# Patient Record
Sex: Male | Born: 1970 | Race: Black or African American | Hispanic: No | Marital: Married
Health system: Southern US, Community
[De-identification: ages and names within clinical notes are randomized; demographics above are authoritative.]

## PROBLEM LIST (undated history)

## (undated) DIAGNOSIS — I10 Essential (primary) hypertension: Secondary | ICD-10-CM

## (undated) HISTORY — PX: FRACTURE SURGERY: SHX138

## (undated) HISTORY — DX: Essential (primary) hypertension: I10

---

## 1998-08-14 ENCOUNTER — Emergency Department (HOSPITAL_COMMUNITY): Admission: EM | Admit: 1998-08-14 | Discharge: 1998-08-14 | Payer: Self-pay | Admitting: Emergency Medicine

## 2002-11-23 ENCOUNTER — Emergency Department (HOSPITAL_COMMUNITY): Admission: EM | Admit: 2002-11-23 | Discharge: 2002-11-23 | Payer: Self-pay | Admitting: Emergency Medicine

## 2003-10-10 ENCOUNTER — Emergency Department (HOSPITAL_COMMUNITY): Admission: EM | Admit: 2003-10-10 | Discharge: 2003-10-10 | Payer: Self-pay | Admitting: Emergency Medicine

## 2003-12-11 ENCOUNTER — Emergency Department (HOSPITAL_COMMUNITY): Admission: EM | Admit: 2003-12-11 | Discharge: 2003-12-11 | Payer: Self-pay | Admitting: Emergency Medicine

## 2004-02-26 ENCOUNTER — Emergency Department (HOSPITAL_COMMUNITY): Admission: EM | Admit: 2004-02-26 | Discharge: 2004-02-26 | Payer: Self-pay | Admitting: Emergency Medicine

## 2004-04-14 ENCOUNTER — Emergency Department (HOSPITAL_COMMUNITY): Admission: EM | Admit: 2004-04-14 | Discharge: 2004-04-14 | Payer: Self-pay | Admitting: Emergency Medicine

## 2004-05-12 ENCOUNTER — Emergency Department (HOSPITAL_COMMUNITY): Admission: EM | Admit: 2004-05-12 | Discharge: 2004-05-13 | Payer: Self-pay | Admitting: *Deleted

## 2006-05-31 ENCOUNTER — Emergency Department (HOSPITAL_COMMUNITY): Admission: EM | Admit: 2006-05-31 | Discharge: 2006-05-31 | Payer: Self-pay | Admitting: Emergency Medicine

## 2006-06-03 ENCOUNTER — Emergency Department (HOSPITAL_COMMUNITY): Admission: EM | Admit: 2006-06-03 | Discharge: 2006-06-03 | Payer: Self-pay | Admitting: Family Medicine

## 2008-09-20 ENCOUNTER — Emergency Department (HOSPITAL_COMMUNITY): Admission: EM | Admit: 2008-09-20 | Discharge: 2008-09-20 | Payer: Self-pay | Admitting: Emergency Medicine

## 2009-02-27 ENCOUNTER — Emergency Department (HOSPITAL_COMMUNITY): Admission: EM | Admit: 2009-02-27 | Discharge: 2009-02-27 | Payer: Self-pay | Admitting: Emergency Medicine

## 2009-04-04 ENCOUNTER — Emergency Department (HOSPITAL_COMMUNITY): Admission: EM | Admit: 2009-04-04 | Discharge: 2009-04-04 | Payer: Self-pay | Admitting: Emergency Medicine

## 2010-03-23 ENCOUNTER — Emergency Department (HOSPITAL_COMMUNITY): Admission: EM | Admit: 2010-03-23 | Discharge: 2010-03-23 | Payer: Self-pay | Admitting: Emergency Medicine

## 2010-11-07 LAB — GC/CHLAMYDIA PROBE AMP, GENITAL
Chlamydia, DNA Probe: NEGATIVE
GC Probe Amp, Genital: POSITIVE — AB

## 2011-01-08 ENCOUNTER — Inpatient Hospital Stay (INDEPENDENT_AMBULATORY_CARE_PROVIDER_SITE_OTHER)
Admission: RE | Admit: 2011-01-08 | Discharge: 2011-01-08 | Disposition: A | Discharge status: Home / Self Care | Payer: Self-pay | Source: Ambulatory Visit | Attending: Family Medicine | Admitting: Family Medicine

## 2011-01-08 DIAGNOSIS — K047 Periapical abscess without sinus: Secondary | ICD-10-CM

## 2011-05-24 ENCOUNTER — Emergency Department (HOSPITAL_COMMUNITY)
Admission: EM | Admit: 2011-05-24 | Discharge: 2011-05-24 | Disposition: A | Discharge status: Home / Self Care | Payer: Self-pay | Attending: Emergency Medicine | Admitting: Emergency Medicine

## 2011-05-24 ENCOUNTER — Emergency Department (HOSPITAL_COMMUNITY): Payer: Self-pay

## 2011-05-24 DIAGNOSIS — N201 Calculus of ureter: Secondary | ICD-10-CM | POA: Insufficient documentation

## 2011-05-24 LAB — BASIC METABOLIC PANEL
BUN: 16 mg/dL (ref 6–23)
Calcium: 10 mg/dL (ref 8.4–10.5)
Creatinine, Ser: 1.38 mg/dL — ABNORMAL HIGH (ref 0.50–1.35)
GFR calc Af Amer: 73 mL/min — ABNORMAL LOW (ref >90)
GFR calc non Af Amer: 63 mL/min — ABNORMAL LOW (ref >90)
Glucose, Bld: 105 mg/dL — ABNORMAL HIGH (ref 70–99)

## 2011-05-24 LAB — CBC
HCT: 39 % (ref 39.0–52.0)
Hemoglobin: 12 g/dL — ABNORMAL LOW (ref 13.0–17.0)
MCHC: 30.8 g/dL (ref 30.0–36.0)
Platelets: 173 10*3/uL (ref 150–400)
RBC: 5.2 MIL/uL (ref 4.22–5.81)
WBC: 6.9 10*3/uL (ref 4.0–10.5)

## 2011-05-24 LAB — URINALYSIS, ROUTINE W REFLEX MICROSCOPIC
Leukocytes, UA: NEGATIVE
Nitrite: NEGATIVE
Protein, ur: NEGATIVE mg/dL
Urobilinogen, UA: 0.2 mg/dL (ref 0.0–1.0)

## 2011-05-24 LAB — DIFFERENTIAL
Basophils Absolute: 0 10*3/uL (ref 0.0–0.1)
Basophils Relative: 0 % (ref 0–1)
Lymphs Abs: 1.2 10*3/uL (ref 0.7–4.0)
Monocytes Relative: 5 % (ref 3–12)
Neutrophils Relative %: 77 % (ref 43–77)

## 2013-03-10 ENCOUNTER — Other Ambulatory Visit: Payer: Self-pay | Admitting: Family Medicine

## 2013-03-10 DIAGNOSIS — R42 Dizziness and giddiness: Secondary | ICD-10-CM

## 2013-03-13 ENCOUNTER — Ambulatory Visit
Admission: RE | Admit: 2013-03-13 | Discharge: 2013-03-13 | Disposition: A | Discharge status: Home / Self Care | Payer: BC Managed Care – PPO | Source: Ambulatory Visit | Attending: Family Medicine | Admitting: Family Medicine

## 2013-03-13 DIAGNOSIS — R42 Dizziness and giddiness: Secondary | ICD-10-CM

## 2013-03-13 MED ORDER — GADOBENATE DIMEGLUMINE 529 MG/ML IV SOLN
20.0000 mL | Freq: Once | INTRAVENOUS | Status: AC | PRN
  Administered 2013-03-13: 20 mL via INTRAVENOUS

## 2014-01-23 ENCOUNTER — Ambulatory Visit: Payer: BC Managed Care – PPO

## 2016-03-25 ENCOUNTER — Encounter (HOSPITAL_COMMUNITY): Payer: Self-pay | Admitting: Emergency Medicine

## 2016-03-25 ENCOUNTER — Emergency Department (HOSPITAL_COMMUNITY): Payer: Self-pay

## 2016-03-25 ENCOUNTER — Emergency Department (HOSPITAL_COMMUNITY)
Admission: EM | Admit: 2016-03-25 | Discharge: 2016-03-25 | Disposition: A | Discharge status: Home / Self Care | Payer: Self-pay | Attending: Emergency Medicine | Admitting: Emergency Medicine

## 2016-03-25 DIAGNOSIS — Y929 Unspecified place or not applicable: Secondary | ICD-10-CM | POA: Insufficient documentation

## 2016-03-25 DIAGNOSIS — R791 Abnormal coagulation profile: Secondary | ICD-10-CM | POA: Insufficient documentation

## 2016-03-25 DIAGNOSIS — Y999 Unspecified external cause status: Secondary | ICD-10-CM | POA: Insufficient documentation

## 2016-03-25 DIAGNOSIS — Y939 Activity, unspecified: Secondary | ICD-10-CM | POA: Insufficient documentation

## 2016-03-25 DIAGNOSIS — T1490XA Injury, unspecified, initial encounter: Secondary | ICD-10-CM

## 2016-03-25 DIAGNOSIS — T148XXA Other injury of unspecified body region, initial encounter: Secondary | ICD-10-CM

## 2016-03-25 DIAGNOSIS — S21112A Laceration without foreign body of left front wall of thorax without penetration into thoracic cavity, initial encounter: Secondary | ICD-10-CM | POA: Insufficient documentation

## 2016-03-25 LAB — CBC WITH DIFFERENTIAL/PLATELET
Basophils Absolute: 0 10*3/uL (ref 0.0–0.1)
Basophils Relative: 0 %
EOS ABS: 0.1 10*3/uL (ref 0.0–0.7)
EOS PCT: 1 %
HCT: 42.4 % (ref 39.0–52.0)
Hemoglobin: 13.1 g/dL (ref 13.0–17.0)
LYMPHS ABS: 1 10*3/uL (ref 0.7–4.0)
Lymphocytes Relative: 14 %
MCH: 23.4 pg — AB (ref 26.0–34.0)
MCHC: 30.9 g/dL (ref 30.0–36.0)
MCV: 75.6 fL — ABNORMAL LOW (ref 78.0–100.0)
MONO ABS: 0.2 10*3/uL (ref 0.1–1.0)
Monocytes Relative: 3 %
Neutro Abs: 5.8 10*3/uL (ref 1.7–7.7)
Neutrophils Relative %: 82 %
PLATELETS: 181 10*3/uL (ref 150–400)
RBC: 5.61 MIL/uL (ref 4.22–5.81)
RDW: 13.5 % (ref 11.5–15.5)
WBC: 7.1 10*3/uL (ref 4.0–10.5)

## 2016-03-25 LAB — COMPREHENSIVE METABOLIC PANEL
ALT: 28 U/L (ref 17–63)
AST: 30 U/L (ref 15–41)
Albumin: 4.8 g/dL (ref 3.5–5.0)
Alkaline Phosphatase: 51 U/L (ref 38–126)
Anion gap: 11 (ref 5–15)
BUN: 11 mg/dL (ref 6–20)
CO2: 20 mmol/L — ABNORMAL LOW (ref 22–32)
Calcium: 9.3 mg/dL (ref 8.9–10.3)
Chloride: 109 mmol/L (ref 101–111)
Creatinine, Ser: 1.43 mg/dL — ABNORMAL HIGH (ref 0.61–1.24)
GFR calc Af Amer: >60 mL/min (ref >60)
GFR calc non Af Amer: 58 mL/min — ABNORMAL LOW (ref >60)
Glucose, Bld: 93 mg/dL (ref 65–99)
Potassium: 3.4 mmol/L — ABNORMAL LOW (ref 3.5–5.1)
Sodium: 140 mmol/L (ref 135–145)
Total Bilirubin: 0.5 mg/dL (ref 0.3–1.2)
Total Protein: 7.5 g/dL (ref 6.5–8.1)

## 2016-03-25 LAB — TYPE AND SCREEN
ABO/RH(D): O POS
Antibody Screen: NEGATIVE
Unit division: 0
Unit division: 0

## 2016-03-25 LAB — I-STAT CHEM 8, ED
BUN: 13 mg/dL (ref 6–20)
CALCIUM ION: 1.13 mmol/L (ref 1.13–1.30)
CHLORIDE: 106 mmol/L (ref 101–111)
Creatinine, Ser: 1.7 mg/dL — ABNORMAL HIGH (ref 0.61–1.24)
GLUCOSE: 95 mg/dL (ref 65–99)
HCT: 45 % (ref 39.0–52.0)
Hemoglobin: 15.3 g/dL (ref 13.0–17.0)
Potassium: 3.4 mmol/L — ABNORMAL LOW (ref 3.5–5.1)
Sodium: 144 mmol/L (ref 135–145)
TCO2: 20 mmol/L (ref 0–100)

## 2016-03-25 LAB — PREPARE FRESH FROZEN PLASMA
UNIT DIVISION: 0
UNIT DIVISION: 0

## 2016-03-25 LAB — I-STAT CG4 LACTIC ACID, ED: Lactic Acid, Venous: 2.1 mmol/L (ref 0.5–1.9)

## 2016-03-25 LAB — PROTIME-INR
INR: 1.08
Prothrombin Time: 14 seconds (ref 11.4–15.2)

## 2016-03-25 LAB — CDS SEROLOGY

## 2016-03-25 LAB — ETHANOL: Alcohol, Ethyl (B): 198 mg/dL — ABNORMAL HIGH (ref <5)

## 2016-03-25 LAB — ABO/RH: ABO/RH(D): O POS

## 2016-03-25 MED ORDER — IOPAMIDOL (ISOVUE-300) INJECTION 61%
INTRAVENOUS | Status: AC
  Administered 2016-03-25: 75 mL
  Filled 2016-03-25: qty 75

## 2016-03-25 MED ORDER — TRAMADOL HCL 50 MG PO TABS: 50.0000 mg | ORAL_TABLET | Freq: Four times a day (QID) | ORAL | 0 refills | Status: DC | PRN

## 2016-03-25 MED ORDER — SULFAMETHOXAZOLE-TRIMETHOPRIM 800-160 MG PO TABS: 1.0000 | ORAL_TABLET | Freq: Two times a day (BID) | ORAL | 0 refills | Status: AC
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
## 2016-03-25 NOTE — ED Notes (Signed)
Family at bedside. 

## 2016-03-25 NOTE — ED Triage Notes (Signed)
See trauma narrator 
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
## 2020-07-05 ENCOUNTER — Other Ambulatory Visit: Payer: Self-pay

## 2020-07-05 ENCOUNTER — Ambulatory Visit (HOSPITAL_COMMUNITY)
Admission: EM | Admit: 2020-07-05 | Discharge: 2020-07-05 | Disposition: A | Discharge status: Home / Self Care | Payer: No Typology Code available for payment source | Attending: Emergency Medicine | Admitting: Emergency Medicine

## 2020-07-05 ENCOUNTER — Encounter (HOSPITAL_COMMUNITY): Payer: Self-pay | Admitting: Emergency Medicine

## 2020-07-05 DIAGNOSIS — K625 Hemorrhage of anus and rectum: Secondary | ICD-10-CM | POA: Insufficient documentation

## 2020-07-05 DIAGNOSIS — I10 Essential (primary) hypertension: Secondary | ICD-10-CM | POA: Insufficient documentation

## 2020-07-05 LAB — CBC WITH DIFFERENTIAL/PLATELET
Abs Immature Granulocytes: 0.02 10*3/uL (ref 0.00–0.07)
Basophils Absolute: 0 10*3/uL (ref 0.0–0.1)
Basophils Relative: 1 %
Eosinophils Absolute: 0.1 10*3/uL (ref 0.0–0.5)
Eosinophils Relative: 2 %
HCT: 43.8 % (ref 39.0–52.0)
Hemoglobin: 13.5 g/dL (ref 13.0–17.0)
Immature Granulocytes: 1 %
Lymphocytes Relative: 26 %
Lymphs Abs: 1.1 10*3/uL (ref 0.7–4.0)
MCH: 23.6 pg — ABNORMAL LOW (ref 26.0–34.0)
MCHC: 30.8 g/dL (ref 30.0–36.0)
MCV: 76.4 fL — ABNORMAL LOW (ref 80.0–100.0)
Monocytes Absolute: 0.4 10*3/uL (ref 0.1–1.0)
Monocytes Relative: 9 %
Neutro Abs: 2.7 10*3/uL (ref 1.7–7.7)
Neutrophils Relative %: 61 %
Platelets: 206 10*3/uL (ref 150–400)
RBC: 5.73 MIL/uL (ref 4.22–5.81)
RDW: 13.4 % (ref 11.5–15.5)
WBC: 4.2 10*3/uL (ref 4.0–10.5)
nRBC: 0 % (ref 0.0–0.2)

## 2020-07-05 LAB — COMPREHENSIVE METABOLIC PANEL
ALT: 29 U/L (ref 0–44)
AST: 25 U/L (ref 15–41)
Albumin: 4.5 g/dL (ref 3.5–5.0)
Alkaline Phosphatase: 50 U/L (ref 38–126)
Anion gap: 10 (ref 5–15)
BUN: 14 mg/dL (ref 6–20)
CO2: 24 mmol/L (ref 22–32)
Calcium: 9.7 mg/dL (ref 8.9–10.3)
Chloride: 103 mmol/L (ref 98–111)
Creatinine, Ser: 1.23 mg/dL (ref 0.61–1.24)
GFR, Estimated: >60 mL/min (ref >60)
Glucose, Bld: 96 mg/dL (ref 70–99)
Potassium: 3.7 mmol/L (ref 3.5–5.1)
Sodium: 137 mmol/L (ref 135–145)
Total Bilirubin: 0.7 mg/dL (ref 0.3–1.2)
Total Protein: 7.8 g/dL (ref 6.5–8.1)

## 2020-07-05 LAB — LIPASE, BLOOD: Lipase: 26 U/L (ref 11–51)

## 2020-07-05 MED ORDER — HYDROCHLOROTHIAZIDE 25 MG PO TABS: 25.0000 mg | ORAL_TABLET | Freq: Every day | ORAL | 0 refills | Status: DC

## 2020-07-05 MED ORDER — POLYETHYLENE GLYCOL 3350 17 G PO PACK: 17.0000 g | PACK | Freq: Every day | ORAL | 0 refills | Status: AC

## 2020-07-05 NOTE — ED Triage Notes (Addendum)
Pt presents with headache and blurred vision, blood in stool, and abdominal pain xs 3 days. States the abdominal pain comes and goes.   States used to be on BP medications about 7 years ago but was told he no longer needed them. Leonides Schanz, PA notified of patients condition.

## 2020-07-05 NOTE — Discharge Instructions (Signed)
Please establish care with a primary care provider. If you have abnormalities on your blood work, I will contact you by phone. Please start MiraLAX once daily for the next 7 days. Please start hydrochlorothiazide once daily for hypertension.

## 2020-07-05 NOTE — ED Provider Notes (Signed)
____________________________________________  Time seen: Approximately 9:20 AM  I have reviewed the triage vital signs and the nursing notes.   HISTORY  Chief Complaint Headache, Blood In Stools, and Abdominal Pain   Historian Patient   HPI Brian James is a 49 y.o. male presents to the urgent care with multiple medical complaints.  Patient states that he is primarily coming into the urgent care due to rectal bleeding that seems to occur after patient has a bowel movement.  Patient states that his bowel movements are very regular and he can go a week without a normal bowel movement.  Patient states that when he wipes, he notices bright red blood.  Patient does not have a history of GI bleeds.  Patient recently decided to quit drinking liquor.  He states that since deciding to quit, his blood pressure has been elevated and he has had a headache.  He denies numbness or tingling in the upper and lower extremities.  No weakness in the upper and lower extremities.  He denies chest pain, chest tightness or current abdominal pain.   History reviewed. No pertinent past medical history.   Immunizations up to date:  Yes.     History reviewed. No pertinent past medical history.  There are no problems to display for this patient.   History reviewed. No pertinent surgical history.  Prior to Admission medications   Medication Sig Start Date End Date Taking? Authorizing Provider  hydrochlorothiazide (HYDRODIURIL) 25 MG tablet Take 1 tablet (25 mg total) by mouth daily. 07/05/20 08/04/20  Orvil Feil, PA-C  polyethylene glycol (MIRALAX) 17 g packet Take 17 g by mouth daily for 7 days. 07/05/20 07/12/20  Orvil Feil, PA-C  traMADol (ULTRAM) 50 MG tablet Take 1 tablet (50 mg total) by mouth every 6 (six) hours as needed. 03/25/16   Gilda Crease, MD    Allergies Patient has no known allergies.  History reviewed. No pertinent family history.  Social History Social  History   Tobacco Use  . Smoking status: Never Smoker  . Smokeless tobacco: Never Used  Substance Use Topics  . Alcohol use: Yes    Alcohol/week: 3.0 standard drinks    Types: 3 Cans of beer per week  . Drug use: No     Review of Systems  Constitutional: No fever/chills Eyes:  No discharge ENT: No upper respiratory complaints. Respiratory: no cough. No SOB/ use of accessory muscles to breath Gastrointestinal:   No nausea, no vomiting.  No diarrhea.  No constipation. Musculoskeletal: Negative for musculoskeletal pain. Skin: Negative for rash, abrasions, lacerations, ecchymosis.    ____________________________________________   PHYSICAL EXAM:  VITAL SIGNS: ED Triage Vitals  Enc Vitals Group     BP 07/05/20 0906 (!) 156/107     Pulse Rate 07/05/20 0906 79     Resp 07/05/20 0906 18     Temp 07/05/20 0906 (!) 97.4 F (36.3 C)     Temp Source 07/05/20 0906 Oral     SpO2 07/05/20 0906 97 %     Weight --      Height --      Head Circumference --      Peak Flow --      Pain Score 07/05/20 0905 5     Pain Loc --      Pain Edu? --      Excl. in GC? --      Constitutional: Alert and oriented. Well appearing and in no acute distress. Eyes: Conjunctivae are  normal. PERRL. EOMI. Head: Atraumatic. ENT:      Nose: No congestion/rhinnorhea.      Mouth/Throat: Mucous membranes are moist.  Neck: No stridor.  FROM.  Cardiovascular: Normal rate, regular rhythm. Normal S1 and S2.  Good peripheral circulation. Respiratory: Normal respiratory effort without tachypnea or retractions. Lungs CTAB. Good air entry to the bases with no decreased or absent breath sounds Gastrointestinal: Bowel sounds x 4 quadrants. Soft and nontender to palpation. No guarding or rigidity. No distention.  No hemorrhoids visualized on exam. Musculoskeletal: Full range of motion to all extremities. No obvious deformities noted Neurologic:  Normal for age. No gross focal neurologic deficits are appreciated.   Skin:  Skin is warm, dry and intact. No rash noted. Psychiatric: Mood and affect are normal for age. Speech and behavior are normal.   ____________________________________________   LABS (all labs ordered are listed, but only abnormal results are displayed)  Labs Reviewed  CBC WITH DIFFERENTIAL/PLATELET - Abnormal; Notable for the following components:      Result Value   MCV 76.4 (*)    MCH 23.6 (*)    All other components within normal limits  COMPREHENSIVE METABOLIC PANEL  LIPASE, BLOOD   ____________________________________________  EKG   ____________________________________________  RADIOLOGY  No results found.  ____________________________________________    PROCEDURES  Procedure(s) performed:     Procedures     Medications - No data to display   ____________________________________________   INITIAL IMPRESSION / ASSESSMENT AND PLAN / ED COURSE  Pertinent labs & imaging results that were available during my care of the patient were reviewed by me and considered in my medical decision making (see chart for details).      Assessment and Plan:  Rectal bleeding Headache Hypertension 49 year old male presents to the urgent care with hypertension, headache and concern for rectal bleeding.  On exam, patient was alert, active and nontoxic-appearing.  On inspection of the rectum, there were no hemorrhoids or fissures visualized.  There was no dried blood at all.  Differential diagnosis includes gastritis, internal hemorrhoid, diverticulosis, colorectal cancer, polyps.  We will obtain CBC, CMP and lipase.  Patient is hemodynamically stable with no active bleeding on exam is appropriate for discharge and to await lab results at home.  Patient states that has been very difficult for him to have a bowel movement recently.  Will start patient on daily MiraLAX for the next 7 days  Patient does not yet have a primary care provider.  Will refer patient to  primary care to schedule a colonoscopy.  In the meantime, will start patient on hydrochlorothiazide for hypertension.     ____________________________________________  FINAL CLINICAL IMPRESSION(S) / ED DIAGNOSES  Final diagnoses:  Hypertension, unspecified type  Rectal bleeding      NEW MEDICATIONS STARTED DURING THIS VISIT:  ED Discharge Orders         Ordered    hydrochlorothiazide (HYDRODIURIL) 25 MG tablet  Daily        07/05/20 0933    polyethylene glycol (MIRALAX) 17 g packet  Daily        07/05/20 0933              This chart was dictated using voice recognition software/Dragon. Despite best efforts to proofread, errors can occur which can change the meaning. Any change was purely unintentional.     Orvil Feil, PA-C 07/05/20 1317

## 2020-07-23 ENCOUNTER — Ambulatory Visit (INDEPENDENT_AMBULATORY_CARE_PROVIDER_SITE_OTHER): Payer: Self-pay | Admitting: Family

## 2020-07-23 ENCOUNTER — Other Ambulatory Visit: Payer: Self-pay

## 2020-07-23 ENCOUNTER — Encounter: Payer: Self-pay | Admitting: Family

## 2020-07-23 VITALS — BP 153/102 | HR 85 | Ht 70.04 in | Wt 260.0 lb

## 2020-07-23 DIAGNOSIS — Z23 Encounter for immunization: Secondary | ICD-10-CM

## 2020-07-23 DIAGNOSIS — I10 Essential (primary) hypertension: Secondary | ICD-10-CM

## 2020-07-23 DIAGNOSIS — Z7689 Persons encountering health services in other specified circumstances: Secondary | ICD-10-CM

## 2020-07-23 MED ORDER — HYDROCHLOROTHIAZIDE 25 MG PO TABS: 25.0000 mg | ORAL_TABLET | Freq: Every day | ORAL | 0 refills | Status: DC

## 2020-07-23 NOTE — Progress Notes (Addendum)
Subjective:    Brian James - 49 y.o. male MRN [id number]  Date of birth: 10-Jan-1971  HPI  Tyreque Finken is to establish care. Patient has no significant PMH.   Current issues and/or concerns: URGENT CARE FOLLOW UP: 07/05/2020: Patient presented with concern for rectal bleeding, headache, and hypertension. CBC, CMP, and lipase obtained. On exam patient was alert, active, and nontoxic appearing.  There was no dried blood. On inspection of the rectum there were no hemorrhoids or fissures visualized. Patient stable with no active bleeding on exam. Discharged and await lab results at home. Patient stated that it was very difficult for him to have a bowel movement recently. Started on Miralax for the next 7 days. Referred to PCP to schedule a colonoscopy. Started on Hydrochlorothiazide for hypertension.    07/23/2020: Time since discharge: 18 days Hospital/facility: [facility] Diagnosis: hypertension, rectal bleeding Procedures/tests: CBC, CMP, lipase Consultants: none New medications: Hydrochlorothiazide, Miralax Discharge instructions:  Await lab results at home and follow-up with PCP Status: stable. States he does not checked blood pressure at home but plans to purchase monitor soon so that he can begin monitoring at home. Says bowel movements are back to normal since beginning Miralax and has not had any further rectal bleeding. Denies family history of colon cancer and/or polyps.   HYPERTENSION FOLLOW-UP: Currently taking: see medication list Have you taken your blood pressure medication today: []  Yes [x]  No  Med Adherence: [x]  Yes    []  No Medication side effects: []  Yes    [x]  No Adherence with salt restriction: [x]  Yes    []  No Exercise: Yes [x]  No []  Home Monitoring?: []  Yes    [x]  No, planning to get home blood pressure monitor soon  Smoking []  Yes [x]  No  SOB? []  Yes    [x]  No Chest Pain?: []  Yes    [x]  No Leg swelling?: []  Yes    [x]  No Headaches?: [x]   Yes, sometimes and takes Advil which helps Dizziness? []  Yes    [x]  No  ROS per HPI   Health Maintenance:  - Yes to flu vaccine today. - Yes Tetanus vaccine today. Health Maintenance Due  Topic Date Due  . Hepatitis C Screening  Never done  . COVID-19 Vaccine (1) Never done  . HIV Screening  Never done   Past Medical History: There are no problems to display for this patient.  Social History   reports that he is a non-smoker but has been exposed to tobacco smoke. He has never used smokeless tobacco. He reports current alcohol use of about 3.0 standard drinks of alcohol per week. He reports that he does not use drugs.   Family History  family history includes Cancer in his mother; Diabetes in his father.   Medications: reviewed and updated   Objective:   Physical Exam BP (!) 153/102 (BP Location: Left Arm, Patient Position: Sitting)   Pulse 85   Ht 5' 10.04" (1.779 m)   Wt 260 lb (117.9 kg)   SpO2 98%   BMI 37.26 kg/m  Physical Exam Constitutional:      Appearance: Normal appearance.  Eyes:     Extraocular Movements: Extraocular movements intact.     Pupils: Pupils are equal, round, and reactive to light.  Cardiovascular:     Rate and Rhythm: Normal rate and regular rhythm.     Pulses: Normal pulses.     Heart sounds: Normal heart sounds.  Pulmonary:     Effort: Pulmonary effort  is normal.     Breath sounds: Normal breath sounds.  Neurological:     General: No focal deficit present.     Mental Status: He is alert and oriented to person, place, and time.  Psychiatric:        Mood and Affect: Mood normal.        Behavior: Behavior normal.       Assessment & Plan:  1. Encounter to establish care: - Patient presents today to establish care.  - Return in 4 to 6 weeks or sooner if needed for annual physical examination, labs, and health maintenance.   2. Essential hypertension: - Blood pressure not at goal during today's visit. Patient asymptomatic without chest  pressure, chest pain, palpitations, and shortness of breath. - Patient has not taken medication for today yet. Counseled patient to take blood pressure medication once he returns home. - Continue Hydrochlorothiazide as prescribed.  - Follow-up with in 2 weeks with clinical pharmacist for blood pressure check. Write down your blood pressure readings each day and bring those results along with your home blood pressure monitor to your appointment. Medications may be adjusted at that time if needed. Counseled patient to take blood pressure prior to appointment. Patient verbalized understanding. - Counseled on blood pressure goal of less than 130/80, low-sodium, DASH diet, medication compliance, 150 minutes of moderate intensity exercise per week as tolerated. Discussed medication compliance, adverse effects. - Follow-up with primary provider as scheduled. - hydrochlorothiazide (HYDRODIURIL) 25 MG tablet; Take 1 tablet (25 mg total) by mouth daily.  Dispense: 90 tablet; Refill: 0  3. Need for immunization against influenza: - Flu vaccine administered during today's visit. - Flu Vaccine QUAD 36+ mos IM  4. Need for tetanus, diphtheria, and acellular pertussis (Tdap) vaccine: - Tdap vaccine administered during today's visit.  Ricky Stabs, NP 07/23/2020, 10:54 AM Primary Care at [facility]

## 2020-07-23 NOTE — Addendum Note (Signed)
Addended by: Rema Fendt on: 07/23/2020 02:31 PM   Modules accepted: Orders

## 2020-07-23 NOTE — Patient Instructions (Addendum)
Continue Hydrochlorothiazide for high blood pressure.   A normal blood pressure is 130/80 or less.  Follow-up in 2 weeks for blood pressure check with the clinical pharmacist at the [facility] at [address] [place], [place], [place] [address].   Return in 4 to 6 weeks or sooner if needed for annual physical examination, labs, and health maintenance.   Flu vaccine today.  Tetanus vaccine today.  Hypertension, Adult Hypertension is another name for high blood pressure. High blood pressure forces your heart to work harder to pump blood. This can cause problems over time. There are two numbers in a blood pressure reading. There is a top number (systolic) over a bottom number (diastolic). It is best to have a blood pressure that is below 120/80. Healthy choices can help lower your blood pressure, or you may need medicine to help lower it. What are the causes? The cause of this condition is not known. Some conditions may be related to high blood pressure. What increases the risk?  Smoking.  Having type 2 diabetes mellitus, high cholesterol, or both.  Not getting enough exercise or physical activity.  Being overweight.  Having too much fat, sugar, calories, or salt (sodium) in your diet.  Drinking too much alcohol.  Having long-term (chronic) kidney disease.  Having a family history of high blood pressure.  Age. Risk increases with age.  Race. You may be at higher risk if you are African American.  Gender. Men are at higher risk than women before age 14. After age 51, women are at higher risk than men.  Having obstructive sleep apnea.  Stress. What are the signs or symptoms?  High blood pressure may not cause symptoms. Very high blood pressure (hypertensive crisis) may cause: ? Headache. ? Feelings of worry or nervousness (anxiety). ? Shortness of breath. ? Nosebleed. ? A feeling of being sick to your stomach (nausea). ? Throwing up  (vomiting). ? Changes in how you see. ? Very bad chest pain. ? Seizures. How is this treated?  This condition is treated by making healthy lifestyle changes, such as: ? Eating healthy foods. ? Exercising more. ? Drinking less alcohol.  Your health care provider may prescribe medicine if lifestyle changes are not enough to get your blood pressure under control, and if: ? Your top number is above 130. ? Your bottom number is above 80.  Your personal target blood pressure may vary. Follow these instructions at home: Eating and drinking   If told, follow the DASH eating plan. To follow this plan: ? Fill one half of your plate at each meal with fruits and vegetables. ? Fill one fourth of your plate at each meal with whole grains. Whole grains include whole-wheat pasta, brown rice, and whole-grain bread. ? Eat or drink low-fat dairy products, such as skim milk or low-fat yogurt. ? Fill one fourth of your plate at each meal with low-fat (lean) proteins. Low-fat proteins include fish, chicken without skin, eggs, beans, and tofu. ? Avoid fatty meat, cured and processed meat, or chicken with skin. ? Avoid pre-made or processed food.  Eat less than 1,500 mg of salt each day.  Do not drink alcohol if: ? Your doctor tells you not to drink. ? You are pregnant, may be pregnant, or are planning to become pregnant.  If you drink alcohol: ? Limit how much you use to:  0-1 drink a day for women.  0-2 drinks a day for men. ? Be aware of  how much alcohol is in your drink. In the [place] one drink equals one 12 oz bottle of beer (355 mL), one 5 oz glass of wine (148 mL), or one 1 oz glass of hard liquor (44 mL). Lifestyle   Work with your doctor to stay at a healthy weight or to lose weight. Ask your doctor what the best weight is for you.  Get at least 30 minutes of exercise most days of the week. This may include walking, swimming, or biking.  Get at least 30 minutes of exercise that  strengthens your muscles (resistance exercise) at least 3 days a week. This may include lifting weights or doing Pilates.  Do not use any products that contain nicotine or tobacco, such as cigarettes, e-cigarettes, and chewing tobacco. If you need help quitting, ask your doctor.  Check your blood pressure at home as told by your doctor.  Keep all follow-up visits as told by your doctor. This is important. Medicines  Take over-the-counter and prescription medicines only as told by your doctor. Follow directions carefully.  Do not skip doses of blood pressure medicine. The medicine does not work as well if you skip doses. Skipping doses also puts you at risk for problems.  Ask your doctor about side effects or reactions to medicines that you should watch for. Contact a doctor if you:  Think you are having a reaction to the medicine you are taking.  Have headaches that keep coming back (recurring).  Feel dizzy.  Have swelling in your ankles.  Have trouble with your vision. Get help right away if you:  Get a very bad headache.  Start to feel mixed up (confused).  Feel weak or numb.  Feel faint.  Have very bad pain in your: ? Chest. ? Belly (abdomen).  Throw up more than once.  Have trouble breathing. Summary  Hypertension is another name for high blood pressure.  High blood pressure forces your heart to work harder to pump blood.  For most people, a normal blood pressure is less than 120/80.  Making healthy choices can help lower blood pressure. If your blood pressure does not get lower with healthy choices, you may need to take medicine. This information is not intended to replace advice given to you by your health care provider. Make sure you discuss any questions you have with your health care provider. Document Revised: 03/30/2018 Document Reviewed: 03/30/2018 Elsevier Patient Education  2020 [employer].

## 2020-07-23 NOTE — Progress Notes (Signed)
Establish care Elevated BP concerns Wants flu/tetanus vaccine

## 2020-08-06 NOTE — Progress Notes (Unsigned)
   S:     PCP: Ricky Stabs, NP  Patient arrives in good spirits. Presents to the clinic for hypertension evaluation, counseling, and management.  Patient was referred and established care with Primary Care Provider on 07/23/20. At that visit, BP was elevated at 153/102 however pt did not take HCTZ prior to visit. No changes were made to HTN medications. .  Today, patient reports ***  Compliance? Took meds this morning? When do you take your meds? Dizziness, headaches, blurred vision? -headaches - sometimes and takes Advil which helps History of swelling? Check Clinic BP? Home BP logs? If no logs, bring to next visit w/ BP cuff Go over BP goals Additional BP therapy if needed . Amlodipine 2.5 mg vs 5 mg Diet??  Exercise??   Medication adherence *** .  Current BP Medications include:  HCTZ 25 mg daily  Antihypertensives tried in the past include: none  Dietary habits include: ***  Exercise habits include:***  Family / Social history:  -WHQ:PRFFMB history includes Cancer in his mother; Diabetes in his father.  -Tobacco use: denies -Alcohol use: 3.0 standard drinks of alcohol per week  ASCVD risk factors include:***   O:  Physical Exam  ROS  Home BP readings: ***  Last 3 Office BP readings: BP Readings from Last 3 Encounters:  07/23/20 (!) 153/102  07/05/20 (!) 156/107  03/25/16 146/89    BMET    Component Value Date/Time   NA 137 07/05/2020 1019   K 3.7 07/05/2020 1019   CL 103 07/05/2020 1019   CO2 24 07/05/2020 1019   GLUCOSE 96 07/05/2020 1019   BUN 14 07/05/2020 1019   CREATININE 1.23 07/05/2020 1019   CALCIUM 9.7 07/05/2020 1019   GFRNONAA >60 07/05/2020 1019   GFRAA >60 03/25/2016 0353    Renal function: CrCl cannot be calculated (Patient's most recent lab result is older than the maximum 21 days allowed.).  Clinical ASCVD: {YES/NO:21197} The ASCVD Risk score Denman George DC Jr., et al., 2013) failed to calculate for the following reasons:    Cannot find a previous HDL lab   Cannot find a previous total cholesterol lab  PHQ-2 Score: ***   A/P: Hypertension diagnosed *** currently *** on current medications. BP Goal = < *** mmHg. Medication adherence ***.  -{Meds adjust:18428} ***.  -F/u labs ordered - *** -Counseled on lifestyle modifications for blood pressure control including reduced dietary sodium, increased exercise, adequate sleep.  Results reviewed and written information provided.   Total time in face-to-face counseling *** minutes.   F/U Clinic Visit in ***.   Fabio Neighbors, PharmD, BCPS PGY2 Ambulatory Care Resident [facility]  Pharmacy

## 2020-08-07 ENCOUNTER — Ambulatory Visit: Payer: No Typology Code available for payment source | Admitting: Pharmacist

## 2020-08-14 ENCOUNTER — Other Ambulatory Visit: Payer: Self-pay

## 2020-08-14 ENCOUNTER — Ambulatory Visit: Payer: Self-pay | Attending: Family Medicine | Admitting: Pharmacist

## 2020-08-14 ENCOUNTER — Encounter: Payer: Self-pay | Admitting: Pharmacist

## 2020-08-14 VITALS — BP 128/78

## 2020-08-14 DIAGNOSIS — I10 Essential (primary) hypertension: Secondary | ICD-10-CM

## 2020-08-14 NOTE — Progress Notes (Signed)
   S:    PCP: Ricky Stabs   Patient arrives in good spirits. Presents to the clinic for hypertension evaluation, counseling, and management. Patient was referred and last seen by Primary Care Provider on 07/23/2020. Prior to that, he was seen in the ED and started on HCTZ for HTN. His BP was elevated with Ms. Zonia Kief, however, pt had not taken his medication prior to that visit.   Today, he denies chest pain, dyspnea, blurred vision or dizziness.  Medication adherence reported. He has taken HCTZ today.   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 Family / Social history:  - FHx: DM, cancer  - Tobacco: never smoker  - Alcohol: stopped drinking   O:  Vitals:   08/14/20 1422  BP: 128/78   Home BP readings: not checking at home  Last 3 Office BP readings: BP Readings from Last 3 Encounters:  08/14/20 128/78  07/23/20 (!) 153/102  07/05/20 (!) 156/107    BMET    Component Value Date/Time   NA 137 07/05/2020 1019   K 3.7 07/05/2020 1019   CL 103 07/05/2020 1019   CO2 24 07/05/2020 1019   GLUCOSE 96 07/05/2020 1019   BUN 14 07/05/2020 1019   CREATININE 1.23 07/05/2020 1019   CALCIUM 9.7 07/05/2020 1019   GFRNONAA >60 07/05/2020 1019   GFRAA >60 03/25/2016 0353    Renal function: CrCl cannot be calculated (Patient's most recent lab result is older than the maximum 21 days allowed.).  Clinical ASCVD: No  The ASCVD Risk score Denman George DC Jr., et al., 2013) failed to calculate for the following reasons:   Cannot find a previous HDL lab   Cannot find a previous total cholesterol lab  A/P: Hypertension newly dx currently at goal on current medications. BP Goal = < 130/80 mmHg. Medication adherence reported.  -Continued HCTZ at current dose. -Counseled on lifestyle modifications for blood pressure control  including reduced dietary sodium, increased exercise, adequate sleep.  Results reviewed and written information provided.   Total time in face-to-face counseling 30 minutes.   F/U Clinic Visit w/ PCP 09/09/2020.  Butch Penny, PharmD, Patsy [place], CPP Clinical Pharmacist [facility] [phone]

## 2020-09-02 NOTE — Progress Notes (Signed)
Patient did not show for appointment.   

## 2020-09-03 ENCOUNTER — Encounter: Payer: No Typology Code available for payment source | Admitting: Family

## 2020-09-03 DIAGNOSIS — Z13220 Encounter for screening for lipoid disorders: Secondary | ICD-10-CM

## 2020-09-03 DIAGNOSIS — Z1159 Encounter for screening for other viral diseases: Secondary | ICD-10-CM

## 2020-09-03 DIAGNOSIS — Z130 Encounter for screening for diseases of the blood and blood-forming organs and certain disorders involving the immune mechanism: Secondary | ICD-10-CM

## 2020-09-03 DIAGNOSIS — Z0000 Encounter for general adult medical examination without abnormal findings: Secondary | ICD-10-CM

## 2020-09-03 DIAGNOSIS — Z1329 Encounter for screening for other suspected endocrine disorder: Secondary | ICD-10-CM

## 2020-09-03 DIAGNOSIS — Z1211 Encounter for screening for malignant neoplasm of colon: Secondary | ICD-10-CM

## 2020-09-03 DIAGNOSIS — Z5320 Procedure and treatment not carried out because of patient's decision for unspecified reasons: Secondary | ICD-10-CM

## 2020-09-03 DIAGNOSIS — Z131 Encounter for screening for diabetes mellitus: Secondary | ICD-10-CM

## 2020-09-03 DIAGNOSIS — Z13228 Encounter for screening for other metabolic disorders: Secondary | ICD-10-CM

## 2020-09-09 ENCOUNTER — Ambulatory Visit: Payer: No Typology Code available for payment source | Admitting: Internal Medicine

## 2020-10-24 ENCOUNTER — Other Ambulatory Visit: Payer: Self-pay | Admitting: Family

## 2020-10-24 DIAGNOSIS — I10 Essential (primary) hypertension: Secondary | ICD-10-CM

## 2021-02-27 ENCOUNTER — Ambulatory Visit (HOSPITAL_COMMUNITY)
Admission: EM | Admit: 2021-02-27 | Discharge: 2021-02-27 | Disposition: A | Discharge status: Home / Self Care | Payer: Self-pay | Attending: Internal Medicine | Admitting: Internal Medicine

## 2021-02-27 ENCOUNTER — Encounter (HOSPITAL_COMMUNITY): Payer: Self-pay | Admitting: Emergency Medicine

## 2021-02-27 ENCOUNTER — Other Ambulatory Visit: Payer: Self-pay

## 2021-02-27 DIAGNOSIS — Z760 Encounter for issue of repeat prescription: Secondary | ICD-10-CM

## 2021-02-27 DIAGNOSIS — I10 Essential (primary) hypertension: Secondary | ICD-10-CM

## 2021-02-27 MED ORDER — HYDROCHLOROTHIAZIDE 25 MG PO TABS: 25.0000 mg | ORAL_TABLET | Freq: Every day | ORAL | 0 refills | Status: DC

## 2021-02-27 NOTE — ED Triage Notes (Signed)
Pt is present today for a refill for BP. Pt states that he is having HA and blurry vision. Pt states that he noticed his sx last week after he ran out of his medications

## 2021-02-27 NOTE — Discharge Instructions (Addendum)
Please go to the hospital if blurry vision or headaches worsen or if you develop chest pain, shortness of breath, dizziness, nausea, vomiting.  Please follow-up with your primary care physician as soon as possible.

## 2021-02-27 NOTE — ED Provider Notes (Signed)
[facility]    CSN: [id number] Arrival date & time: 02/27/21  9518      History   Chief Complaint Chief Complaint  Patient presents with   Blurred Vision   Headache   Medication Refill    HPI Brian James is a 50 y.o. male.   Patient presents to the urgent care today for a refill on hydrochlorothiazide blood pressure medication.  Patient states that he was diagnosed with hypertension approximately 2 to 3 months ago and was started on this medication.  Has been tolerating medication well but states that he ran out of medication approximately 1 week ago.  Patient called PCP and was told to go to urgent care for refill.  Patient has been having intermittent blurry vision and intermittent headaches for approximately 1 week.  Patient states that blurry vision usually occurs when he is trying to look at his phone.  Denies any current blurry vision, chest pain, shortness of breath, headache, dizziness.   Headache Medication Refill  Past Medical History:  Diagnosis Date   Hypertension     There are no problems to display for this patient.   Past Surgical History:  Procedure Laterality Date   FRACTURE SURGERY Right        Home Medications    Prior to Admission medications   Medication Sig Start Date End Date Taking? Authorizing Provider  hydrochlorothiazide (HYDRODIURIL) 25 MG tablet Take 1 tablet (25 mg total) by mouth daily. 02/27/21 05/28/21  Lance Muss, FNP  traMADol (ULTRAM) 50 MG tablet Take 1 tablet (50 mg total) by mouth every 6 (six) hours as needed. Patient not taking: Reported on 07/23/2020 03/25/16   Gilda Crease, MD    Family History Family History  Problem Relation Age of Onset   Cancer Mother    Diabetes Father     Social History Social History   Tobacco Use   Smoking status: Passive Smoke Exposure - Never Smoker   Smokeless tobacco: Never  Substance Use Topics   Alcohol use: Yes    Alcohol/week: 3.0 standard drinks     Types: 3 Cans of beer per week   Drug use: No     Allergies   Patient has no known allergies.   Review of Systems Review of Systems Per HPI  Physical Exam Triage Vital Signs ED Triage Vitals  Enc Vitals Group     BP 02/27/21 1037 (!) 148/100     Pulse Rate 02/27/21 1037 75     Resp 02/27/21 1037 18     Temp 02/27/21 1037 98 F (36.7 C)     Temp src --      SpO2 02/27/21 1037 99 %     Weight --      Height --      Head Circumference --      Peak Flow --      Pain Score 02/27/21 1038 0     Pain Loc --      Pain Edu? --      Excl. in GC? --    No data found.  Updated Vital Signs BP (!) 156/106 (BP Location: Right Arm)   Pulse 75   Temp 98 F (36.7 C)   Resp 18   SpO2 99%   Visual Acuity Right Eye Distance:   Left Eye Distance:   Bilateral Distance:    Right Eye Near:   Left Eye Near:    Bilateral Near:     Physical Exam Constitutional:  General: He is not in acute distress. HENT:     Head: Normocephalic.  Cardiovascular:     Rate and Rhythm: Normal rate and regular rhythm.     Pulses: Normal pulses.     Heart sounds: Normal heart sounds.  Pulmonary:     Effort: Pulmonary effort is normal. No respiratory distress.     Breath sounds: Normal breath sounds. No wheezing, rhonchi or rales.  Skin:    General: Skin is warm and dry.  Neurological:     General: No focal deficit present.     Mental Status: He is alert and oriented to person, place, and time. Mental status is at baseline.     Cranial Nerves: Cranial nerves are intact.     Sensory: Sensation is intact.     Motor: Motor function is intact.     Coordination: Coordination is intact.     Gait: Gait is intact.     UC Treatments / Results  Labs (all labs ordered are listed, but only abnormal results are displayed) Labs Reviewed - No data to display  EKG   Radiology No results found.  Procedures Procedures (including critical care time)  Medications Ordered in  UC Medications - No data to display  Initial Impression / Assessment and Plan / UC Course  I have reviewed the triage vital signs and the nursing notes.  Pertinent labs & imaging results that were available during my care of the patient were reviewed by me and considered in my medical decision making (see chart for details).     Refilled hydrochlorothiazide 25 mg daily.  Confirmed this dosage with patient and patient's chart.  Patient was advised to start medication immediately and to monitor symptoms.  Patient to monitor blood pressures at home with home blood pressure cuff.  Patient advised to go to the hospital if blurry vision or headaches worsen or if he develops chest pain, shortness of breath, dizziness, nausea, vomiting.  Also advised patient to follow-up with PCP as soon as possible for further evaluation and management.  Patient voiced understanding and was agreeable with plan. Final Clinical Impressions(s) / UC Diagnoses   Final diagnoses:  Essential hypertension  Medication refill     Discharge Instructions      Please go to the hospital if blurry vision or headaches worsen or if you develop chest pain, shortness of breath, dizziness, nausea, vomiting.  Please follow-up with your primary care physician as soon as possible.     ED Prescriptions     Medication Sig Dispense Auth. Provider   hydrochlorothiazide (HYDRODIURIL) 25 MG tablet Take 1 tablet (25 mg total) by mouth daily. 90 tablet Lance Muss, FNP      PDMP not reviewed this encounter.   Lance Muss, FNP 02/27/21 1112

## 2021-07-03 ENCOUNTER — Encounter (HOSPITAL_COMMUNITY): Payer: Self-pay

## 2021-07-03 ENCOUNTER — Other Ambulatory Visit: Payer: Self-pay

## 2021-07-03 ENCOUNTER — Ambulatory Visit (HOSPITAL_COMMUNITY)
Admission: EM | Admit: 2021-07-03 | Discharge: 2021-07-03 | Disposition: A | Discharge status: Home / Self Care | Payer: Self-pay | Attending: Emergency Medicine | Admitting: Emergency Medicine

## 2021-07-03 DIAGNOSIS — Z760 Encounter for issue of repeat prescription: Secondary | ICD-10-CM

## 2021-07-03 DIAGNOSIS — I10 Essential (primary) hypertension: Secondary | ICD-10-CM

## 2021-07-03 MED ORDER — HYDROCHLOROTHIAZIDE 25 MG PO TABS: 25.0000 mg | ORAL_TABLET | Freq: Every day | ORAL | 1 refills | Status: DC

## 2021-07-03 NOTE — Discharge Instructions (Addendum)
Decrease your salt intake. diet and exercise will lower your blood pressure significantly. It is important to keep your blood pressure under good control, as having a elevated blood pressure for prolonged periods of time significantly increases your risk of stroke, heart attacks, kidney damage, eye damage, and other problems. Measure your blood pressure once a day, preferably at the same time every day. Keep a log of this and bring it to your next doctor's appointment.  Bring your blood pressure cuff as well.  Return here in 2 weeks for blood pressure recheck if you're unable to find a primary care physician by then. Return immediately to the ER if you start having chest pain, headache, problems seeing, problems talking, problems walking, if you feel like you're about to pass out, if you do pass out, if you have a seizure, or for any other concerns.  Below is a list of primary care practices who are taking new patients for you to follow-up with.  [facility] -multiple locations.  See website at [email or web address]  [facility] internal medicine clinic Ground Floor - [facility], [address] [place], [place], [place] [address] [phone]  [facility] Primary Care at [facility] [address] [place], [place] [address] [phone]  [facility] [address] [place], [place] [address] [phone]  [facility] [phone] [address] [place] [address]  [facility]: [address] [place] [address]  [phone]  [facility]: [address] [place] [address]  [phone]  [facility] : [address] [place] [address] [phone]  [facility] Primary Care: [address] [place] [address] [phone]  [facility]  Primary Care: [address] [place] [address] [phone]  Dr. Oneal Grout [address] [facility] [place] [address]  [phone]  Go to www.goodrx.com  or www.costplusdrugs.com to look up your medications. This will give you a list of where you can find your prescriptions at the most affordable prices. Or ask the pharmacist what the cash price is, or if they have any other discount programs available to help make your medication more affordable. This can be less expensive than what you would pay with insurance.

## 2021-07-03 NOTE — ED Provider Notes (Signed)
HPI  SUBJECTIVE:  Brian James is a 50 y.o. male who presents with 2 days of a constant, gradual onset, frontal headache described as a band squeezing around his head, slightly blurry near vision and intermittent photophobia.  He ran out of his blood pressure medication 2 weeks ago.  He has no issues with distance vision.  No nausea, vomiting, arm or leg weakness, facial droop, slurred speech, discoordination, syncope, seizures, neck stiffness.  No chest pain, shortness of breath, abdominal pain, pain tearing through to his back, lower extremity edema, anuria, hematuria.  No known illicit or recent decongestant use.  He has not been measuring his blood pressure at home since he ran out of medication.  He tried Tylenol for the headache without improvement in symptoms.  There are no aggravating or alleviating factors.  He states that he gets identical headaches when his blood pressure is elevated.  Past medical history of hypertension which worked well for him.  He had no side effects from it.  No history of chronic kidney disease, MI, CVA.  PMD: None.  Past Medical History:  Diagnosis Date   Hypertension     Past Surgical History:  Procedure Laterality Date   FRACTURE SURGERY Right     Family History  Problem Relation Age of Onset   Cancer Mother    Diabetes Father     Social History   Tobacco Use   Smoking status: Passive Smoke Exposure - Never Smoker   Smokeless tobacco: Never  Substance Use Topics   Alcohol use: Yes    Alcohol/week: 3.0 standard drinks    Types: 3 Cans of beer per week   Drug use: No    No current facility-administered medications for this encounter.  Current Outpatient Medications:    hydrochlorothiazide (HYDRODIURIL) 25 MG tablet, Take 1 tablet (25 mg total) by mouth daily., Disp: 30 tablet, Rfl: 1  No Known Allergies   ROS  As noted in HPI.   Physical Exam  BP (!) 156/109   Pulse 84   Temp (!) 97.5 F (36.4 C) (Oral)   Resp 19   SpO2 100%    BP Readings from Last 3 Encounters:  07/03/21 (!) 156/109  02/27/21 (!) 156/106  08/14/20 128/78    Constitutional: Well developed, well nourished, no acute distress Eyes:  EOMI, conjunctiva normal bilaterally HENT: Normocephalic, atraumatic,mucus membranes moist Respiratory: Normal inspiratory effort, lungs clear bilaterally Cardiovascular: Normal rate, regular rhythm no murmurs rubs or gallops GI: nondistended skin: No rash, skin intact Musculoskeletal: No lower extremity edema. Neurologic: Alert & oriented x 3, cranial nerves III through XII intact.  Finger-nose, heel shin within normal limits, tandem gait steady. Psychiatric: Speech and behavior appropriate   ED Course   Medications - No data to display  No orders of the defined types were placed in this encounter.   No results found for this or any previous visit (from the past 24 hour(s)). No results found.  ED Clinical Impression  1. Essential hypertension   2. Encounter for medication refill      ED Assessment/Plan  Pt hypertensive today.  Has not taken BP meds in 2 weeks.  Does not know what his blood pressure has been running since running out of medications.  Pt has no historical evidence of end organ damage.  He states that he has had identical headaches before and his blood pressure is elevated.  He reports blurry near vision when he looks at his phone, but no issues with his far  vision.  Suspect that this is more related to age rather than a hypertensive emergency.  He has been seen here with identical symptoms in the past.    Pt denies any CNS type sx such as worst HA of his life, double vision, visual loss, focal paresis, or new onset seizure activity. Pt denies any CV sx such as CP, dyspnea, palpitations, pedal edema, tearing pain radiating to back or abd. Pt denied any renal sx such as anuria or hematuria. Pt denies illicit drug use, most notably cocaine, or recent use of OTC medications such as nasal  decongestants.  will refill hydrochlorothiazide, have him get a blood pressure cuff and keep a log of his blood pressure.  will give him a primary care list and order assistance in finding a PMD.  May return here in 2 weeks for blood pressure recheck and medication adjustment if unable to find a PMD by then.  Discussed the importance of taking BP medications. Pt to f/u as OP.  ER return precautions given.  Discussed MDM, treatment plan, and plan for follow-up with patient. Discussed sn/sx that should prompt return to the ED. patient agrees with plan.   Meds ordered this encounter  Medications   hydrochlorothiazide (HYDRODIURIL) 25 MG tablet    Sig: Take 1 tablet (25 mg total) by mouth daily.    Dispense:  30 tablet    Refill:  1      *This clinic note was created using [employer]. Therefore, there may be occasional mistakes despite careful proofreading.  ?    Melynda Ripple, MD 07/05/21 [phone]

## 2021-07-03 NOTE — ED Triage Notes (Signed)
Pt presents with c/o hypertension, headache and states he has been out of this BP medication for 2 weeks.

## 2022-02-10 ENCOUNTER — Encounter (HOSPITAL_COMMUNITY): Payer: Self-pay

## 2022-02-10 ENCOUNTER — Ambulatory Visit (HOSPITAL_COMMUNITY)
Admission: EM | Admit: 2022-02-10 | Discharge: 2022-02-10 | Disposition: A | Discharge status: Home / Self Care | Payer: Self-pay | Attending: Family | Admitting: Family

## 2022-02-10 DIAGNOSIS — H1131 Conjunctival hemorrhage, right eye: Secondary | ICD-10-CM

## 2022-02-10 DIAGNOSIS — J029 Acute pharyngitis, unspecified: Secondary | ICD-10-CM

## 2022-02-10 DIAGNOSIS — H1031 Unspecified acute conjunctivitis, right eye: Secondary | ICD-10-CM

## 2022-02-10 DIAGNOSIS — S0591XA Unspecified injury of right eye and orbit, initial encounter: Secondary | ICD-10-CM

## 2022-02-10 DIAGNOSIS — I10 Essential (primary) hypertension: Secondary | ICD-10-CM

## 2022-02-10 DIAGNOSIS — R051 Acute cough: Secondary | ICD-10-CM

## 2022-02-10 LAB — POCT RAPID STREP A, ED / UC: Streptococcus, Group A Screen (Direct): NEGATIVE

## 2022-02-10 MED ORDER — GENTAMICIN SULFATE 0.3 % OP SOLN: 2.0000 [drp] | Freq: Four times a day (QID) | OPHTHALMIC | 0 refills | Status: AC
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
## 2022-02-10 NOTE — Discharge Instructions (Addendum)
Recommend start Gentamicin eye drops- place 2 drops in right eye 4 times a day for 5 days. Discussed concern over swelling of right eye and decreased vision- you really should see an Eye doctor or go to the ER for further evaluation to confirm that you will not permanently lose vision in your right eye. I have provided 2 Eye doctor offices to call or again, you can go to the ER and they will not turn you away for financial reasons.  Also recommend OTC cough medication. Avoid decongestants which can raise your blood pressure. May take OTC Delsym every 12 hours as needed for cough.  Call [facility] to schedule appointment to establish care and for further management of blood pressure. Continue current medication as planned.  If vision in your right eye does not improve within 12 to 24 hours, go to an eye doctor ASAP or go to the ER.

## 2022-05-25 ENCOUNTER — Ambulatory Visit: Payer: Self-pay | Admitting: *Deleted

## 2022-05-25 ENCOUNTER — Encounter (HOSPITAL_COMMUNITY): Payer: Self-pay | Admitting: Emergency Medicine

## 2022-05-25 ENCOUNTER — Ambulatory Visit (HOSPITAL_COMMUNITY)
Admission: EM | Admit: 2022-05-25 | Discharge: 2022-05-25 | Disposition: A | Discharge status: Home / Self Care | Payer: Self-pay | Attending: Family Medicine | Admitting: Family Medicine

## 2022-05-25 DIAGNOSIS — R42 Dizziness and giddiness: Secondary | ICD-10-CM

## 2022-05-25 DIAGNOSIS — R519 Headache, unspecified: Secondary | ICD-10-CM

## 2022-05-25 DIAGNOSIS — I10 Essential (primary) hypertension: Secondary | ICD-10-CM

## 2022-05-25 MED ORDER — AMLODIPINE BESYLATE 5 MG PO TABS: 5.0000 mg | ORAL_TABLET | Freq: Every day | ORAL | 0 refills | Status: DC

## 2022-05-25 NOTE — Telephone Encounter (Signed)
  Chief Complaint: elevated BP Symptoms: BP 154/112 during UC visit today at 1230.  Ran out of BP meds. Patient does not have BP monitor to recheck BP. Reports headache easing no other sx reported. Patient has gotten Rx for amlodipine now  and has taken dose for today .  Frequency: "a while" Pertinent Negatives: Patient denies dizziness. No reports of chest pain difficulty breathing, no visual issues, no report of N/T weakness on either side of body.  Disposition: [] ED /[] Urgent Care (no appt availability in office) / [] Appointment(In office/virtual)/ []  [facility]/ [] Home Care/ [] Refused Recommended Disposition /[x] [facility]/ []  Follow-up with PCP Additional Notes:   New patient appt scheduled earliest 07/29/22. Recommended mobile bus if sx return. Recommended to go to a pharmacy to recheck BP today since taking amlodipine. Rest and drink water. Recommended ED if sx return.    Reason for Disposition  Systolic BP  >= 570 OR Diastolic >= 177  Answer Assessment - Initial Assessment Questions 1. BLOOD PRESSURE: "What is the blood pressure?" "Did you take at least two measurements 5 minutes apart?"     BP 154/112 at UC today . Unable to check now  2. ONSET: "When did you take your blood pressure?"     Took at [facility] per patient report 3. HOW: "How did you take your blood pressure?" (e.g., automatic home BP monitor, visiting nurse)     At UC  4. HISTORY: "Do you have a history of high blood pressure?"     Yes  5. MEDICINES: "Are you taking any medicines for blood pressure?" "Have you missed any doses recently?"     Yes. Ran out and has not been taking  6. OTHER SYMPTOMS: "Do you have any symptoms?" (e.g., blurred vision, chest pain, difficulty breathing, headache, weakness)     Headache easing up now since taking amlodipine from UC  7. PREGNANCY: "Is there any chance you are pregnant?" "When was your last menstrual period?"     na  Protocols used: Blood Pressure -  High-A-AH

## 2022-05-25 NOTE — ED Provider Notes (Signed)
[place]    CSN: [id number] Arrival date & time: 05/25/22  1024      History   Chief Complaint Chief Complaint  Patient presents with   Headache   Medication Refill    HPI Brian James is a 51 y.o. male.   Patient is here for f/u htn.  Has been out of the hctz for quite a while.  He states that he did check his bp while on the medication at home, but it still seemed elevated.  He does not have a pcp.  Has been coming here intermittently for refills of hctz.  Last couple of days feeling poorly with headache and dizziness.  Some blurry vision.       Past Medical History:  Diagnosis Date   Hypertension     There are no problems to display for this patient.   Past Surgical History:  Procedure Laterality Date   FRACTURE SURGERY Right        Home Medications    Prior to Admission medications   Medication Sig Start Date End Date Taking? Authorizing Provider  hydrochlorothiazide (HYDRODIURIL) 25 MG tablet Take 1 tablet (25 mg total) by mouth daily. 07/03/21 09/01/21  Melynda Ripple, MD    Family History Family History  Problem Relation Age of Onset   Cancer Mother    Diabetes Father     Social History Social History   Tobacco Use   Smoking status: Passive Smoke Exposure - Never Smoker   Smokeless tobacco: Never  Substance Use Topics   Alcohol use: Yes    Alcohol/week: 3.0 standard drinks of alcohol    Types: 3 Cans of beer per week   Drug use: No     Allergies   Patient has no known allergies.   Review of Systems Review of Systems  Constitutional: Negative.   HENT: Negative.    Respiratory: Negative.    Cardiovascular: Negative.   Gastrointestinal: Negative.   Genitourinary: Negative.   Musculoskeletal: Negative.   Neurological:  Positive for dizziness and headaches.  Hematological: Negative.   Psychiatric/Behavioral: Negative.       Physical Exam Triage Vital Signs ED Triage Vitals  Enc Vitals Group     BP  05/25/22 1230 (!) 154/112     Pulse Rate 05/25/22 1230 77     Resp 05/25/22 1230 16     Temp 05/25/22 1230 97.6 F (36.4 C)     Temp Source 05/25/22 1230 Oral     SpO2 05/25/22 1230 100 %     Weight --      Height --      Head Circumference --      Peak Flow --      Pain Score 05/25/22 1229 9     Pain Loc --      Pain Edu? --      Excl. in [place]? --    No data found.  Updated Vital Signs BP (!) 154/112 (BP Location: Left Arm)   Pulse 77   Temp 97.6 F (36.4 C) (Oral)   Resp 16   SpO2 100%   Visual Acuity Right Eye Distance:   Left Eye Distance:   Bilateral Distance:    Right Eye Near:   Left Eye Near:    Bilateral Near:     Physical Exam Constitutional:      Appearance: He is well-developed.  HENT:     Head: Normocephalic.  Cardiovascular:     Rate and Rhythm: Normal rate and regular  rhythm.  Pulmonary:     Effort: Pulmonary effort is normal.     Breath sounds: Normal breath sounds.  Musculoskeletal:     Cervical back: Normal range of motion and neck supple.  Neurological:     Mental Status: He is alert and oriented to person, place, and time.     Cranial Nerves: No cranial nerve deficit or facial asymmetry.     Sensory: No sensory deficit.  Psychiatric:        Mood and Affect: Mood normal.        Speech: Speech normal.        Behavior: Behavior normal.      UC Treatments / Results  Labs (all labs ordered are listed, but only abnormal results are displayed) Labs Reviewed - No data to display  EKG   Radiology No results found.  Procedures Procedures (including critical care time)  Medications Ordered in UC Medications - No data to display  Initial Impression / Assessment and Plan / UC Course  I have reviewed the triage vital signs and the nursing notes.  Pertinent labs & imaging results that were available during my care of the patient were reviewed by me and considered in my medical decision making (see chart for details).    Final  Clinical Impressions(s) / UC Diagnoses   Final diagnoses:  Hypertension, unspecified type  Nonintractable headache, unspecified chronicity pattern, unspecified headache type  Dizziness and giddiness     Discharge Instructions      You were seen today for elevated blood pressure, headache and dizziness.  You have declined going to the ER at this time.   I have sent out a blood pressure medication for you today.  Please take this today as soon as possible.  Please take it easy, rest, and get plenty of fluids.  If you continue with symptoms, or feel worse, then please go to the ER for further evaluation.  You need to follow up with a primary care provider for this.  You may go to [email or web address] to find one.     ED Prescriptions     Medication Sig Dispense Auth. Provider   amLODipine (NORVASC) 5 MG tablet Take 1 tablet (5 mg total) by mouth daily. 30 tablet Rondel Oh, MD      PDMP not reviewed this encounter.   Rondel Oh, MD 05/25/22 1311

## 2022-05-25 NOTE — ED Triage Notes (Signed)
Pt reports having high BP and headaches for a week. Reports been out of HTN medications for a month. Doesn't have a PCP.

## 2022-05-25 NOTE — Discharge Instructions (Signed)
You were seen today for elevated blood pressure, headache and dizziness.  You have declined going to the ER at this time.   I have sent out a blood pressure medication for you today.  Please take this today as soon as possible.  Please take it easy, rest, and get plenty of fluids.  If you continue with symptoms, or feel worse, then please go to the ER for further evaluation.  You need to follow up with a primary care provider for this.  You may go to [email or web address] to find one.

## 2022-07-21 ENCOUNTER — Encounter (HOSPITAL_COMMUNITY): Payer: Self-pay | Admitting: Emergency Medicine

## 2022-07-21 ENCOUNTER — Ambulatory Visit (HOSPITAL_COMMUNITY)
Admission: EM | Admit: 2022-07-21 | Discharge: 2022-07-21 | Disposition: A | Discharge status: Home / Self Care | Payer: Self-pay | Attending: Physician Assistant | Admitting: Physician Assistant

## 2022-07-21 DIAGNOSIS — I10 Essential (primary) hypertension: Secondary | ICD-10-CM

## 2022-07-21 MED ORDER — AMLODIPINE BESYLATE 5 MG PO TABS: 5.0000 mg | ORAL_TABLET | Freq: Every day | ORAL | 1 refills | Status: AC

## 2022-07-21 NOTE — Discharge Instructions (Signed)
Advised to restart the amlodipine 5 mg once daily.  Advised to follow-up with [facility] at the appointment Tylenol everywhere 22nd for reevaluation of blood pressure.  Advised to return to urgent care as needed.

## 2022-07-21 NOTE — ED Provider Notes (Signed)
[facility]    CSN: [id number] Arrival date & time: 07/21/22  1010      History   Chief Complaint Chief Complaint  Patient presents with   Headache   Medication Refill    HPI Brian James is a 51 y.o. male.   51 year old male presents for medication refill and with headache.  Patient indicates that he ran out of his Norvasc about a week ago.  He relates that since being out of the Norvasc he has noticed his blood pressure has been elevated he has also been having some intermittent headaches due to the elevated blood pressure.  Patient indicates he does have a appointment to see his PCP and become established at [facility].  He relates his appointment is on February 22 in the afternoon.  Patient indicates that the Norvasc 5 mg daily was doing well and he had no problems or side effects with the medication.  Desires to have this renewed today.  Patient denies chest pain, shortness of breath, nausea or vomiting.   Headache Medication Refill   Past Medical History:  Diagnosis Date   Hypertension     There are no problems to display for this patient.   Past Surgical History:  Procedure Laterality Date   FRACTURE SURGERY Right        Home Medications    Prior to Admission medications   Medication Sig Start Date End Date Taking? Authorizing Provider  amLODipine (NORVASC) 5 MG tablet Take 1 tablet (5 mg total) by mouth daily. 07/21/22   Ellsworth Lennox, PA-C    Family History Family History  Problem Relation Age of Onset   Cancer Mother    Diabetes Father     Social History Social History   Tobacco Use   Smoking status: Passive Smoke Exposure - Never Smoker   Smokeless tobacco: Never  Substance Use Topics   Alcohol use: Yes    Alcohol/week: 3.0 standard drinks of alcohol    Types: 3 Cans of beer per week   Drug use: No     Allergies   Patient has no known allergies.   Review of Systems Review of Systems  Neurological:   Positive for headaches.     Physical Exam Triage Vital Signs ED Triage Vitals  Enc Vitals Group     BP 07/21/22 1253 (!) 166/122     Pulse Rate 07/21/22 1253 83     Resp 07/21/22 1253 18     Temp 07/21/22 1253 97.6 F (36.4 C)     Temp Source 07/21/22 1253 Oral     SpO2 07/21/22 1252 96 %     Weight --      Height --      Head Circumference --      Peak Flow --      Pain Score 07/21/22 1252 6     Pain Loc --      Pain Edu? --      Excl. in GC? --    No data found.  Updated Vital Signs BP (!) 166/122 (BP Location: Left Arm)   Pulse 83   Temp 97.6 F (36.4 C) (Oral)   Resp 18   SpO2 96%   Visual Acuity Right Eye Distance:   Left Eye Distance:   Bilateral Distance:    Right Eye Near:   Left Eye Near:    Bilateral Near:     Physical Exam Constitutional:      Appearance: He is well-developed.  HENT:  Right Ear: Tympanic membrane and ear canal normal.     Left Ear: Tympanic membrane and ear canal normal.     Mouth/Throat:     Mouth: Mucous membranes are moist.     Pharynx: Oropharynx is clear.  Cardiovascular:     Rate and Rhythm: Normal rate and regular rhythm.     Heart sounds: Normal heart sounds.  Pulmonary:     Effort: Pulmonary effort is normal.     Breath sounds: Normal breath sounds and air entry. No wheezing, rhonchi or rales.  Lymphadenopathy:     Cervical: No cervical adenopathy.  Neurological:     Mental Status: He is alert.      UC Treatments / Results  Labs (all labs ordered are listed, but only abnormal results are displayed) Labs Reviewed - No data to display  EKG   Radiology No results found.  Procedures Procedures (including critical care time)  Medications Ordered in UC Medications - No data to display  Initial Impression / Assessment and Plan / UC Course  I have reviewed the triage vital signs and the nursing notes.  Pertinent labs & imaging results that were available during my care of the patient were reviewed by  me and considered in my medical decision making (see chart for details).    Plan: 1.  The essential hypertension will be treated with the following: A.  Restart amlodipine 5 mg daily to control blood pressure. B.  Advised to follow-up with PCP at [facility] at the appointment time of February 22. 2.  Advised follow-up urgent care as needed. Final Clinical Impressions(s) / UC Diagnoses   Final diagnoses:  Essential hypertension     Discharge Instructions      Advised to restart the amlodipine 5 mg once daily.  Advised to follow-up with [facility] at the appointment Tylenol everywhere 22nd for reevaluation of blood pressure.  Advised to return to urgent care as needed.    ED Prescriptions     Medication Sig Dispense Auth. Provider   amLODipine (NORVASC) 5 MG tablet Take 1 tablet (5 mg total) by mouth daily. 90 tablet Ellsworth Lennox, PA-C      PDMP not reviewed this encounter.   Ellsworth Lennox, PA-C 07/21/22 1317

## 2022-07-21 NOTE — ED Triage Notes (Signed)
Pt reports being out of BP medications for about 1 week and a mild headache.

## 2022-07-29 ENCOUNTER — Ambulatory Visit (INDEPENDENT_AMBULATORY_CARE_PROVIDER_SITE_OTHER): Payer: Self-pay | Admitting: Primary Care

## 2022-09-24 ENCOUNTER — Ambulatory Visit (INDEPENDENT_AMBULATORY_CARE_PROVIDER_SITE_OTHER): Payer: Medicaid Other | Admitting: Primary Care
